# Patient Record
Sex: Male | Born: 1980 | Race: Black or African American | Hispanic: No | Marital: Married | Smoking: Current every day smoker
Health system: Southern US, Community
[De-identification: ages and names within clinical notes are randomized; demographics above are authoritative.]

## PROBLEM LIST (undated history)

## (undated) DIAGNOSIS — I10 Essential (primary) hypertension: Secondary | ICD-10-CM

## (undated) HISTORY — PX: ANTERIOR CRUCIATE LIGAMENT REPAIR: SHX115

## (undated) HISTORY — DX: Essential (primary) hypertension: I10

---

## 2011-10-29 ENCOUNTER — Emergency Department (INDEPENDENT_AMBULATORY_CARE_PROVIDER_SITE_OTHER): Payer: 59

## 2011-10-29 ENCOUNTER — Emergency Department (HOSPITAL_BASED_OUTPATIENT_CLINIC_OR_DEPARTMENT_OTHER)
Admission: EM | Admit: 2011-10-29 | Discharge: 2011-10-29 | Disposition: A | Discharge status: Home / Self Care | Payer: 59 | Attending: Emergency Medicine | Admitting: Emergency Medicine

## 2011-10-29 DIAGNOSIS — Y9367 Activity, basketball: Secondary | ICD-10-CM

## 2011-10-29 DIAGNOSIS — M25569 Pain in unspecified knee: Secondary | ICD-10-CM

## 2011-10-29 DIAGNOSIS — M25469 Effusion, unspecified knee: Secondary | ICD-10-CM

## 2011-10-29 DIAGNOSIS — X500XXA Overexertion from strenuous movement or load, initial encounter: Secondary | ICD-10-CM

## 2011-10-29 DIAGNOSIS — F17200 Nicotine dependence, unspecified, uncomplicated: Secondary | ICD-10-CM | POA: Insufficient documentation

## 2011-10-29 MED ORDER — HYDROCODONE-ACETAMINOPHEN 5-500 MG PO TABS: 1.0000 | ORAL_TABLET | Freq: Four times a day (QID) | ORAL | Status: AC | PRN

## 2011-10-29 NOTE — ED Notes (Signed)
Pt reports right knee pain that started yesterday while playing basketball.  Pain worsens with ambulation.

## 2011-10-29 NOTE — ED Provider Notes (Signed)
History     CSN: [id number] Arrival date & time: 10/29/2011  3:10 PM   First MD Initiated Contact with Patient 10/29/11 1522      Chief Complaint  Patient presents with  . Knee Pain    (Consider location/radiation/quality/duration/timing/severity/associated sxs/prior treatment) HPI Comments: Pt states that he was playing basketball and someone hit him in his right knee and he has had pain and swelling since  Patient is a 30 y.o. male presenting with knee pain. The history is provided by the patient. No language interpreter was used.  Knee Pain This is a new problem. The current episode started yesterday. The problem occurs constantly. The problem has been unchanged. Pertinent negatives include no numbness or weakness. The symptoms are aggravated by standing. Treatments tried: crutches. The treatment provided mild relief.    History reviewed. No pertinent past medical history.  History reviewed. No pertinent past surgical history.  No family history on file.  History  Substance Use Topics  . Smoking status: Current Everyday Smoker -- 0.5 packs/day  . Smokeless tobacco: Not on file  . Alcohol Use: Yes     weekends      Review of Systems  Neurological: Negative for weakness and numbness.  All other systems reviewed and are negative.    Allergies  Review of patient's allergies indicates no known allergies.  Home Medications   Current Outpatient Rx  Name Route Sig Dispense Refill  . HYDROCODONE-ACETAMINOPHEN 5-500 MG PO TABS Oral Take 1-2 tablets by mouth every 6 (six) hours as needed for pain. 15 tablet 0    BP 149/72  Pulse 91  Temp(Src) 98.9 F (37.2 C) (Oral)  Resp 16  Ht 6\' 1"  (1.854 m)  Wt 234 lb (106.142 kg)  BMI 30.87 kg/m2  SpO2 100%  Physical Exam  Nursing note and vitals reviewed. Constitutional: He appears well-developed and well-nourished.  Cardiovascular: Normal rate and regular rhythm.   Pulmonary/Chest: Effort normal and breath sounds  normal.  Musculoskeletal: He exhibits edema and tenderness.       Pt has generalized tenderness to the right knee:pt has decreased rom  Neurological: He is alert.  Skin: Skin is warm and dry.  Psychiatric: He has a normal mood and affect.    ED Course  Procedures (including critical care time)  Labs Reviewed - No data to display Dg Knee Complete 4 Views Right  10/29/2011  *RADIOLOGY REPORT*  Clinical Data: Basketball injury.  Twisting injury.  Pain.  RIGHT KNEE - COMPLETE 4+ VIEW  Comparison: None.  Findings: There is a moderate joint effusion.  Deep lateral femoral notch is seen on the lateral view which can be seen with ACL injuries.  No additional acute bony abnormality.  IMPRESSION: Deep lateral femoral notch which can be associated with ACL injuries.  Moderate joint effusion. MRI would be helpful for further evaluation if felt clinically indicated.  Original Report Authenticated By: Cyndie Chime, M.D.     1. Knee effusion       MDM  Discussed possibility of tear with pt:pt place in immobilizer:pt treat  With something for pain        Teressa Lower, NP 10/29/11 1615

## 2011-10-30 NOTE — ED Provider Notes (Signed)
Medical screening examination/treatment/procedure(s) were performed by non-physician practitioner and as supervising physician I was immediately available for consultation/collaboration.    Rathana Viveros R Arina Torry, MD 10/30/11 0001 

## 2011-11-04 ENCOUNTER — Other Ambulatory Visit: Payer: Self-pay | Admitting: Family Medicine

## 2011-11-04 DIAGNOSIS — M25561 Pain in right knee: Secondary | ICD-10-CM

## 2011-11-05 ENCOUNTER — Ambulatory Visit
Admission: RE | Admit: 2011-11-05 | Discharge: 2011-11-05 | Disposition: A | Discharge status: Home / Self Care | Payer: 59 | Source: Ambulatory Visit | Attending: Family Medicine | Admitting: Family Medicine

## 2011-11-05 DIAGNOSIS — M25561 Pain in right knee: Secondary | ICD-10-CM

## 2011-12-09 ENCOUNTER — Other Ambulatory Visit: Payer: Self-pay | Admitting: Orthopedic Surgery

## 2011-12-09 ENCOUNTER — Ambulatory Visit
Admission: RE | Admit: 2011-12-09 | Discharge: 2011-12-09 | Disposition: A | Discharge status: Home / Self Care | Payer: 59 | Source: Ambulatory Visit | Attending: Orthopedic Surgery | Admitting: Orthopedic Surgery

## 2011-12-09 DIAGNOSIS — M25561 Pain in right knee: Secondary | ICD-10-CM

## 2011-12-09 DIAGNOSIS — R0602 Shortness of breath: Secondary | ICD-10-CM

## 2011-12-09 MED ORDER — IOHEXOL 300 MG/ML  SOLN
125.0000 mL | Freq: Once | INTRAMUSCULAR | Status: AC | PRN
  Administered 2011-12-09: 125 mL via INTRAVENOUS

## 2013-03-06 IMAGING — CR DG KNEE COMPLETE 4+V*R*
4 series · 4 of 4 positions shown · non-contrast
Comparison: None.

CLINICAL DATA: Basketball injury.  Twisting injury.  Pain.

RIGHT KNEE - COMPLETE 4+ VIEW

[t knee ap right]
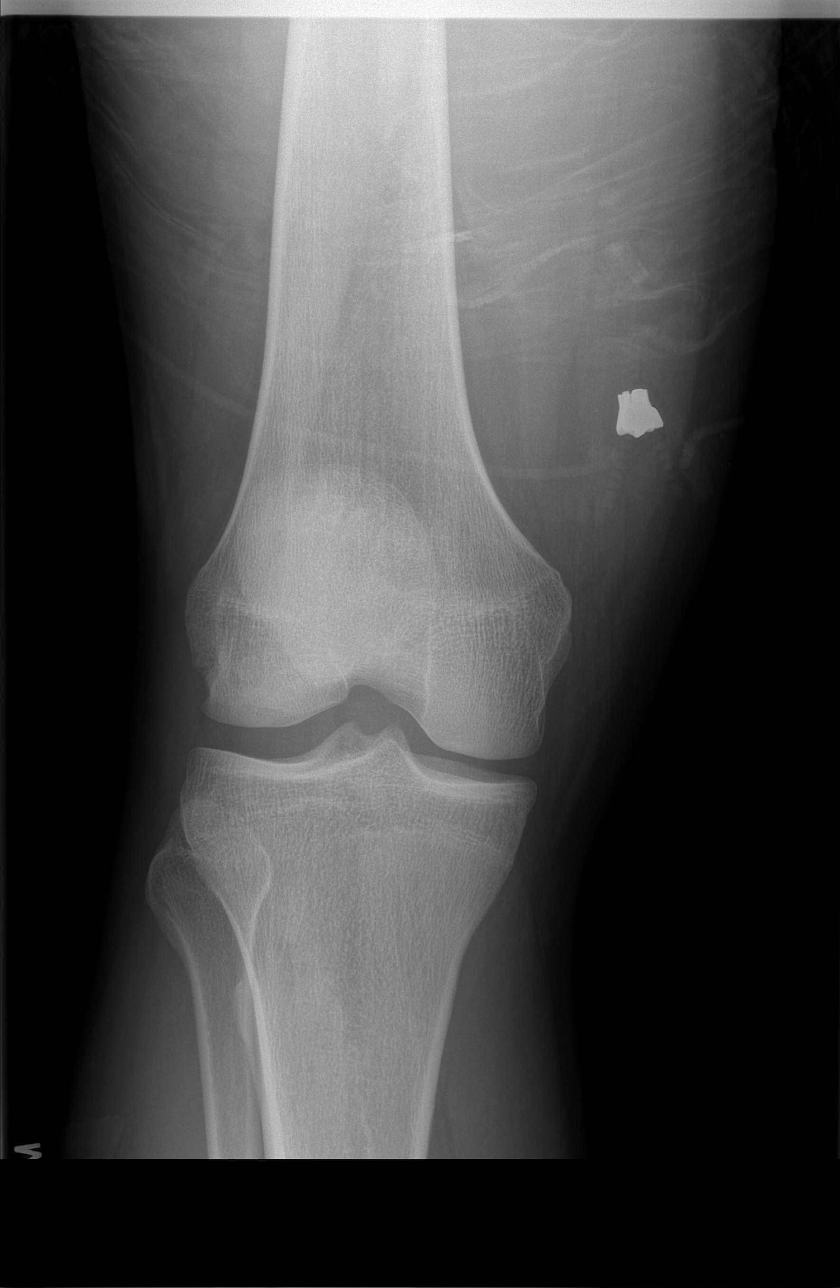

[t knee oblique right (1 of 2)]
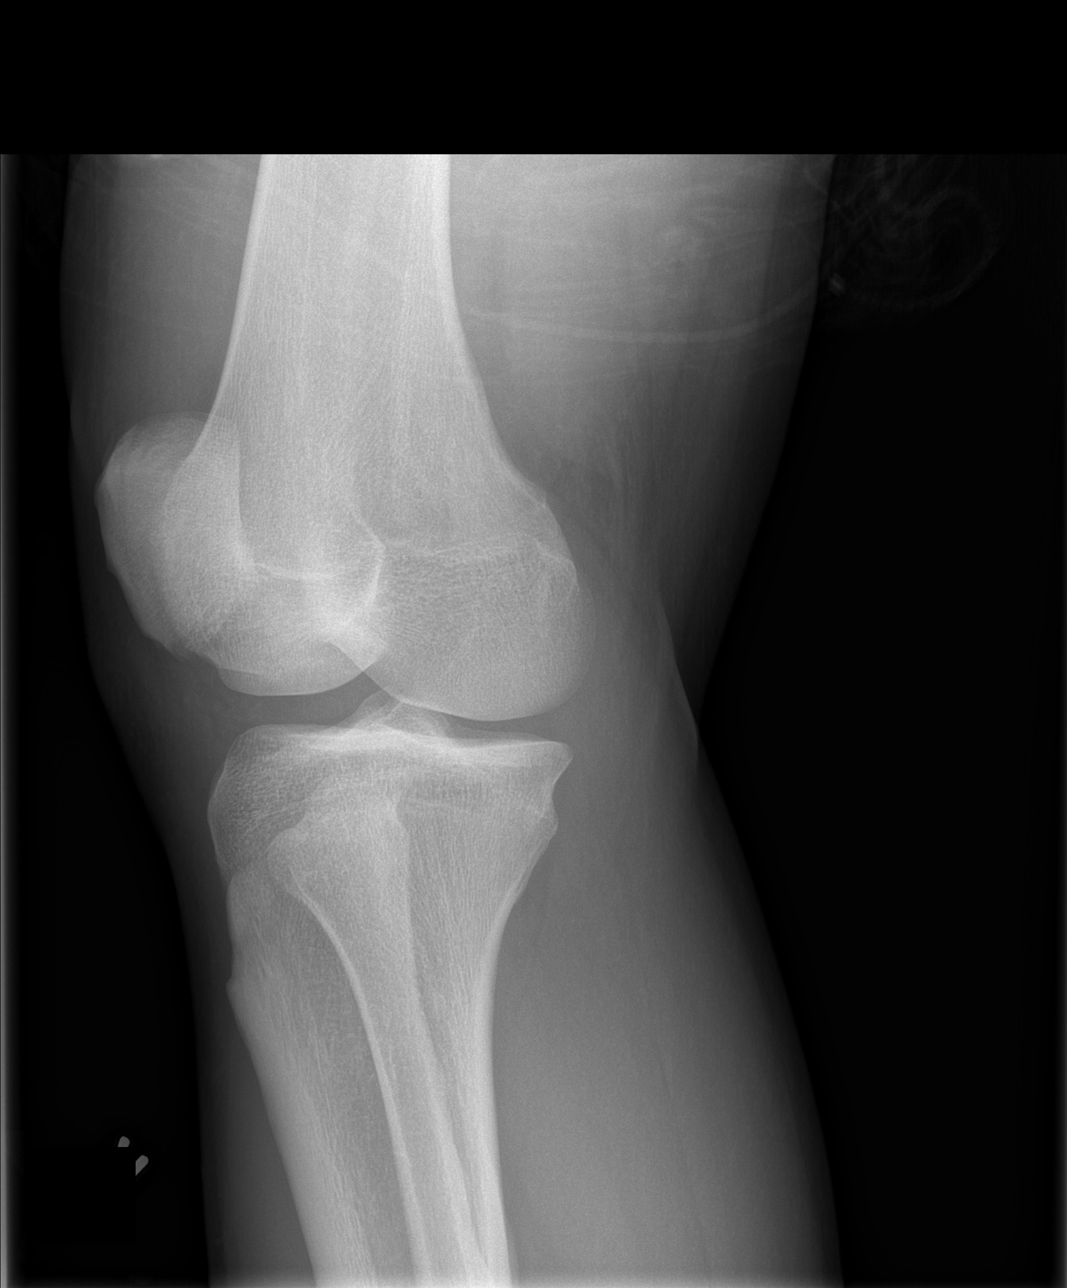

[t knee oblique right (2 of 2)]
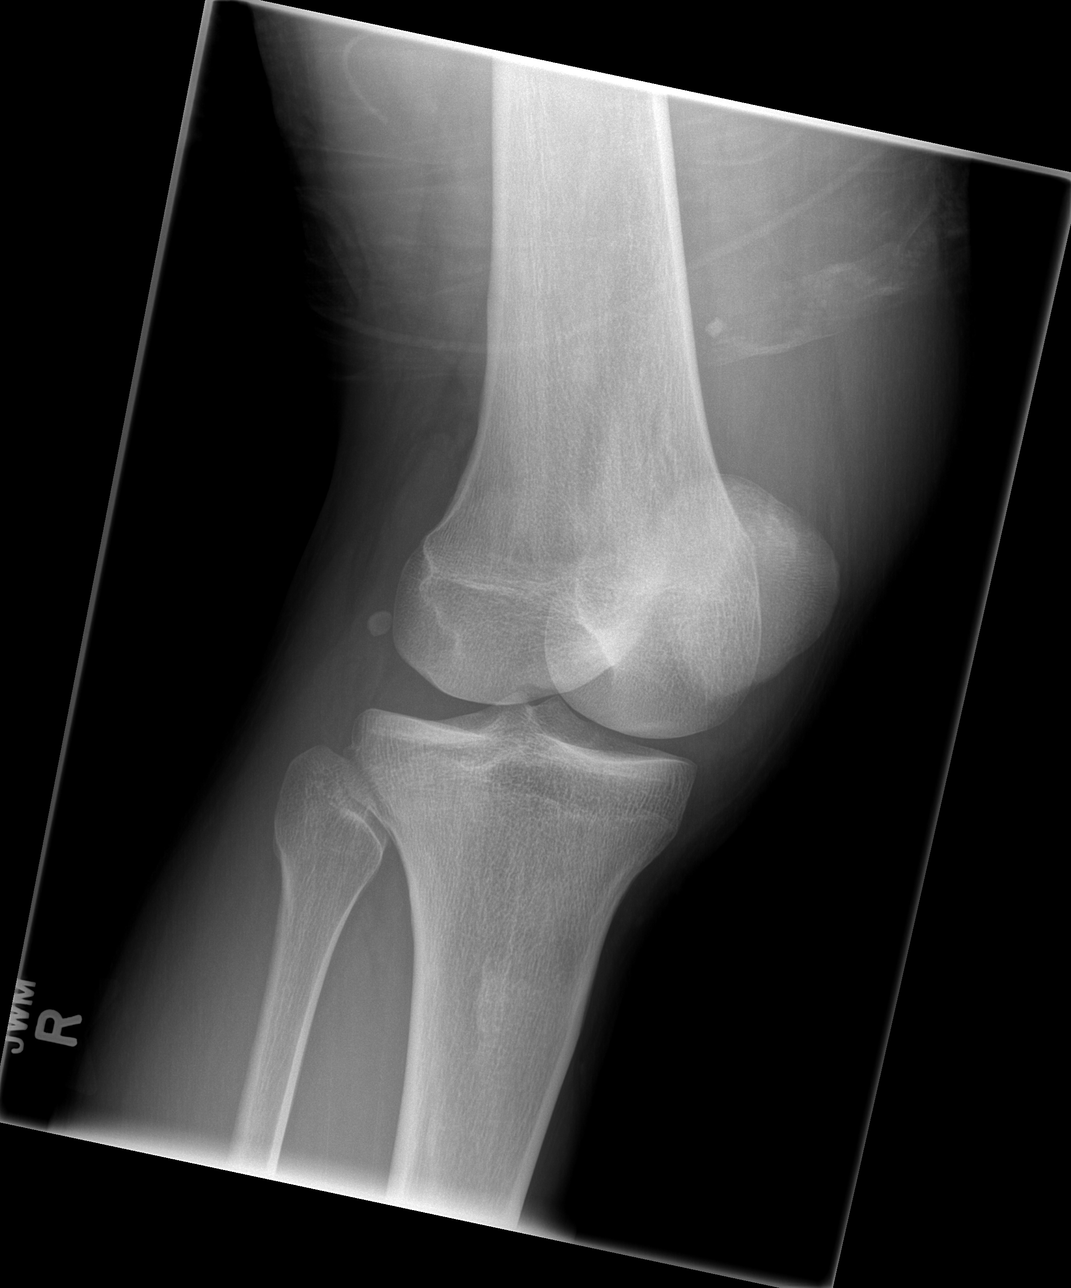

[t knee lat right]
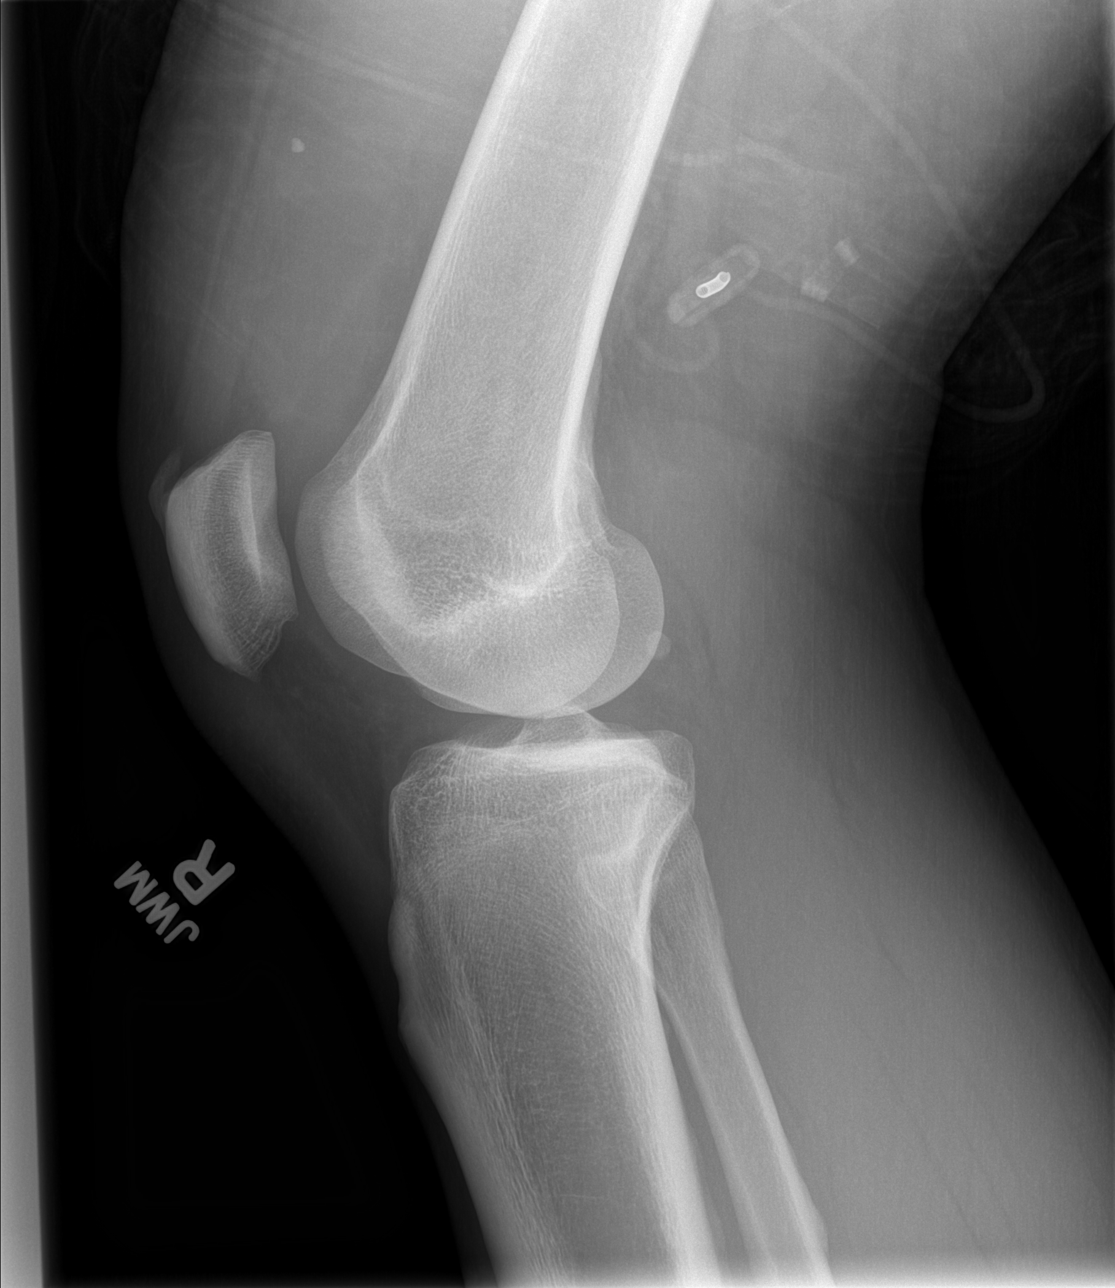

[4 of 4 positions shown; findings below may reference images not displayed]

FINDINGS: There is a moderate joint effusion.  Deep lateral femoral
notch is seen on the lateral view which can be seen with ACL
injuries.  No additional acute bony abnormality.
IMPRESSION: Deep lateral femoral notch which can be associated with ACL
injuries.  Moderate joint effusion. MRI would be helpful for
further evaluation if felt clinically indicated.

## 2015-10-24 ENCOUNTER — Telehealth: Payer: Self-pay | Admitting: Internal Medicine

## 2015-10-24 NOTE — Telephone Encounter (Signed)
Patient is requesting to establish care with Dr Yetta BarreJones.

## 2015-10-24 NOTE — Telephone Encounter (Signed)
yes

## 2015-10-26 NOTE — Telephone Encounter (Signed)
Got scheduled  °

## 2015-11-02 ENCOUNTER — Encounter: Payer: Self-pay | Admitting: Internal Medicine

## 2015-11-02 ENCOUNTER — Ambulatory Visit (INDEPENDENT_AMBULATORY_CARE_PROVIDER_SITE_OTHER): Payer: Managed Care, Other (non HMO) | Admitting: Internal Medicine

## 2015-11-02 ENCOUNTER — Other Ambulatory Visit (INDEPENDENT_AMBULATORY_CARE_PROVIDER_SITE_OTHER): Payer: Managed Care, Other (non HMO)

## 2015-11-02 VITALS — BP 142/78 | HR 78 | Temp 97.5°F | Resp 16 | Ht 73.0 in | Wt 254.0 lb

## 2015-11-02 DIAGNOSIS — I10 Essential (primary) hypertension: Secondary | ICD-10-CM | POA: Insufficient documentation

## 2015-11-02 DIAGNOSIS — Z0000 Encounter for general adult medical examination without abnormal findings: Secondary | ICD-10-CM

## 2015-11-02 LAB — CBC WITH DIFFERENTIAL/PLATELET
Basophils Absolute: 0 10*3/uL (ref 0.0–0.1)
Basophils Relative: 0.6 % (ref 0.0–3.0)
EOS PCT: 3.5 % (ref 0.0–5.0)
Eosinophils Absolute: 0.2 10*3/uL (ref 0.0–0.7)
HEMATOCRIT: 44.8 % (ref 39.0–52.0)
Hemoglobin: 15 g/dL (ref 13.0–17.0)
LYMPHS ABS: 2.1 10*3/uL (ref 0.7–4.0)
LYMPHS PCT: 33 % (ref 12.0–46.0)
MCHC: 33.4 g/dL (ref 30.0–36.0)
MCV: 90.7 fl (ref 78.0–100.0)
MONOS PCT: 15.4 % — AB (ref 3.0–12.0)
Monocytes Absolute: 1 10*3/uL (ref 0.1–1.0)
NEUTROS ABS: 3.1 10*3/uL (ref 1.4–7.7)
NEUTROS PCT: 47.5 % (ref 43.0–77.0)
PLATELETS: 224 10*3/uL (ref 150.0–400.0)
RBC: 4.94 Mil/uL (ref 4.22–5.81)
RDW: 14.3 % (ref 11.5–15.5)
WBC: 6.4 10*3/uL (ref 4.0–10.5)

## 2015-11-02 LAB — LIPID PANEL
Cholesterol: 140 mg/dL (ref 0–200)
HDL: 45.4 mg/dL (ref >39.00)
LDL Cholesterol: 69 mg/dL (ref 0–99)
NONHDL: 94.14
Total CHOL/HDL Ratio: 3
Triglycerides: 127 mg/dL (ref 0.0–149.0)
VLDL: 25.4 mg/dL (ref 0.0–40.0)

## 2015-11-02 LAB — COMPREHENSIVE METABOLIC PANEL
ALT: 46 U/L (ref 0–53)
AST: 29 U/L (ref 0–37)
Albumin: 4.2 g/dL (ref 3.5–5.2)
Alkaline Phosphatase: 51 U/L (ref 39–117)
BUN: 13 mg/dL (ref 6–23)
CHLORIDE: 104 meq/L (ref 96–112)
CO2: 31 meq/L (ref 19–32)
Calcium: 9.1 mg/dL (ref 8.4–10.5)
Creatinine, Ser: 1.21 mg/dL (ref 0.40–1.50)
GFR: 87.48 mL/min (ref >60.00)
GLUCOSE: 97 mg/dL (ref 70–99)
POTASSIUM: 3.9 meq/L (ref 3.5–5.1)
Sodium: 140 mEq/L (ref 135–145)
Total Bilirubin: 0.5 mg/dL (ref 0.2–1.2)
Total Protein: 7 g/dL (ref 6.0–8.3)

## 2015-11-02 LAB — URINALYSIS, ROUTINE W REFLEX MICROSCOPIC
BILIRUBIN URINE: NEGATIVE
HGB URINE DIPSTICK: NEGATIVE
KETONES UR: NEGATIVE
LEUKOCYTES UA: NEGATIVE
Nitrite: NEGATIVE
RBC / HPF: NONE SEEN (ref 0–?)
Specific Gravity, Urine: 1.02 (ref 1.000–1.030)
TOTAL PROTEIN, URINE-UPE24: NEGATIVE
URINE GLUCOSE: NEGATIVE
UROBILINOGEN UA: 0.2 (ref 0.0–1.0)
WBC, UA: NONE SEEN (ref 0–?)
pH: 7 (ref 5.0–8.0)

## 2015-11-02 LAB — TSH: TSH: 0.88 u[IU]/mL (ref 0.35–4.50)

## 2015-11-02 NOTE — Patient Instructions (Signed)
Hypertension Hypertension, commonly called high blood pressure, is when the force of blood pumping through your arteries is too strong. Your arteries are the blood vessels that carry blood from your heart throughout your body. A blood pressure reading consists of a higher number over a lower number, such as 110/72. The higher number (systolic) is the pressure inside your arteries when your heart pumps. The lower number (diastolic) is the pressure inside your arteries when your heart relaxes. Ideally you want your blood pressure below 120/80. Hypertension forces your heart to work harder to pump blood. Your arteries may become narrow or stiff. Having untreated or uncontrolled hypertension can cause heart attack, stroke, kidney disease, and other problems. RISK FACTORS Some risk factors for high blood pressure are controllable. Others are not.  Risk factors you cannot control include:   Race. You may be at higher risk if you are African American.  Age. Risk increases with age.  Gender. Men are at higher risk than women before age 45 years. After age 65, women are at higher risk than men. Risk factors you can control include:  Not getting enough exercise or physical activity.  Being overweight.  Getting too much fat, sugar, calories, or salt in your diet.  Drinking too much alcohol. SIGNS AND SYMPTOMS Hypertension does not usually cause signs or symptoms. Extremely high blood pressure (hypertensive crisis) may cause headache, anxiety, shortness of breath, and nosebleed. DIAGNOSIS To check if you have hypertension, your health care provider will measure your blood pressure while you are seated, with your arm held at the level of your heart. It should be measured at least twice using the same arm. Certain conditions can cause a difference in blood pressure between your right and left arms. A blood pressure reading that is higher than normal on one occasion does not mean that you need treatment. If  it is not clear whether you have high blood pressure, you may be asked to return on a different day to have your blood pressure checked again. Or, you may be asked to monitor your blood pressure at home for 1 or more weeks. TREATMENT Treating high blood pressure includes making lifestyle changes and possibly taking medicine. Living a healthy lifestyle can help lower high blood pressure. You may need to change some of your habits. Lifestyle changes may include:  Following the DASH diet. This diet is high in fruits, vegetables, and whole grains. It is low in salt, red meat, and added sugars.  Keep your sodium intake below 2,300 mg per day.  Getting at least 30-45 minutes of aerobic exercise at least 4 times per week.  Losing weight if necessary.  Not smoking.  Limiting alcoholic beverages.  Learning ways to reduce stress. Your health care provider may prescribe medicine if lifestyle changes are not enough to get your blood pressure under control, and if one of the following is true:  You are 18-59 years of age and your systolic blood pressure is above 140.  You are 60 years of age or older, and your systolic blood pressure is above 150.  Your diastolic blood pressure is above 90.  You have diabetes, and your systolic blood pressure is over 140 or your diastolic blood pressure is over 90.  You have kidney disease and your blood pressure is above 140/90.  You have heart disease and your blood pressure is above 140/90. Your personal target blood pressure may vary depending on your medical conditions, your age, and other factors. HOME CARE INSTRUCTIONS    Have your blood pressure rechecked as directed by your health care provider.   Take medicines only as directed by your health care provider. Follow the directions carefully. Blood pressure medicines must be taken as prescribed. The medicine does not work as well when you skip doses. Skipping doses also puts you at risk for  problems.  Do not smoke.   Monitor your blood pressure at home as directed by your health care provider. SEEK MEDICAL CARE IF:   You think you are having a reaction to medicines taken.  You have recurrent headaches or feel dizzy.  You have swelling in your ankles.  You have trouble with your vision. SEEK IMMEDIATE MEDICAL CARE IF:  You develop a severe headache or confusion.  You have unusual weakness, numbness, or feel faint.  You have severe chest or abdominal pain.  You vomit repeatedly.  You have trouble breathing. MAKE SURE YOU:   Understand these instructions.  Will watch your condition.  Will get help right away if you are not doing well or get worse.   This information is not intended to replace advice given to you by your health care provider. Make sure you discuss any questions you have with your health care provider.   Document Released: 11/04/2005 Document Revised: 03/21/2015 Document Reviewed: 08/27/2013 Elsevier Interactive Patient Education 2016 [employer]  

## 2015-11-02 NOTE — Progress Notes (Signed)
Pre visit review using our clinic review tool, if applicable. No additional management support is needed unless otherwise documented below in the visit note. 

## 2015-11-03 NOTE — Progress Notes (Signed)
Subjective:  Patient ID: Omar Keller, male    DOB: [birth year]  Age: 34 y.o. MRN: [id number]  CC: Hypertension and Annual Exam   HPI Omar Keller presents for a complete physical and blood pressure check. He feels well and offers no complaints. He has a history of borderline blood pressure elevation but has never been treated for high blood pressure.  History Nile has a past medical history of Hypertension.   He has no past surgical history on file.   His family history includes Diabetes in his father and mother; Hypertension in his mother. There is no history of Alcohol abuse, Cancer, COPD, Depression, Drug abuse, Early death, Hearing loss, Heart disease, Hyperlipidemia, Kidney disease, or Stroke.He reports that he has been smoking.  He has never used smokeless tobacco. He reports that he drinks about 3.0 oz of alcohol per week. He reports that he does not use illicit drugs.  No outpatient prescriptions prior to visit.   No facility-administered medications prior to visit.    ROS Review of Systems  Constitutional: Negative.  Negative for fever, chills, diaphoresis, appetite change and fatigue.  HENT: Negative.   Eyes: Negative.   Respiratory: Negative.  Negative for cough, choking, chest tightness, shortness of breath and stridor.   Cardiovascular: Negative.  Negative for chest pain, palpitations and leg swelling.  Gastrointestinal: Negative.  Negative for nausea, vomiting, abdominal pain, diarrhea, constipation and blood in stool.  Endocrine: Negative.   Genitourinary: Negative.  Negative for dysuria, urgency, frequency, hematuria, flank pain and difficulty urinating.  Musculoskeletal: Negative.  Negative for myalgias, back pain, joint swelling and arthralgias.  Skin: Negative.  Negative for color change and rash.  Allergic/Immunologic: Negative.   Neurological: Negative.  Negative for dizziness, syncope, weakness, light-headedness and numbness.  Hematological: Negative.   Negative for adenopathy. Does not bruise/bleed easily.  Psychiatric/Behavioral: Negative.  Negative for sleep disturbance and dysphoric mood. The patient is not nervous/anxious.     Objective:  BP 142/78 mmHg  Pulse 78  Temp(Src) 97.5 F (36.4 C) (Oral)  Resp 16  Ht 6\' 1"  (1.854 m)  Wt 254 lb (115.214 kg)  BMI 33.52 kg/m2  SpO2 97%  Physical Exam  Constitutional: He is oriented to person, place, and time. No distress.  HENT:  Head: Normocephalic and atraumatic.  Mouth/Throat: Oropharynx is clear and moist. No oropharyngeal exudate.  Eyes: Conjunctivae are normal. Right eye exhibits no discharge. Left eye exhibits no discharge. No scleral icterus.  Neck: Normal range of motion. Neck supple. No JVD present. No tracheal deviation present. No thyromegaly present.  Cardiovascular: Normal rate, regular rhythm, normal heart sounds and intact distal pulses.  Exam reveals no gallop and no friction rub.   No murmur heard. EKG-early repol is noted in the lateral leads, there is no LVH, Q waves or acute ST-T wave changes.  Pulmonary/Chest: Effort normal and breath sounds normal. No stridor. No respiratory distress. He has no wheezes. He has no rales. He exhibits no tenderness.  Abdominal: Soft. Bowel sounds are normal. He exhibits no distension and no mass. There is no tenderness. There is no rebound and no guarding. Hernia confirmed negative in the right inguinal area and confirmed negative in the left inguinal area.  Genitourinary: Testes normal and penis normal. Right testis shows no mass, no swelling and no tenderness. Right testis is descended. Left testis shows no mass, no swelling and no tenderness. Left testis is descended. Circumcised. No penile erythema or penile tenderness. No discharge found.  Musculoskeletal: Normal range  of motion. He exhibits no edema or tenderness.  Lymphadenopathy:    He has no cervical adenopathy.       Right: No inguinal adenopathy present.       Left: No  inguinal adenopathy present.  Neurological: He is oriented to person, place, and time.  Skin: Skin is warm and dry. No rash noted. He is not diaphoretic. No erythema. No pallor.  Psychiatric: He has a normal mood and affect. His behavior is normal. Judgment and thought content normal.  Vitals reviewed.     Assessment & Plan:   Omar Keller was seen today for hypertension and annual exam.  Diagnoses and all orders for this visit:  Routine general medical examination at a health care facility- vaccines were reviewed and updated, physical examination completed, labs ordered and reviewed, he was given patient education material. -     Lipid panel; Future -     TSH; Future -     Urinalysis, Routine w reflex microscopic (not at [facility]); Future -     Comprehensive metabolic panel; Future -     CBC with Differential/Platelet; Future  Essential hypertension, benign- EKG and labs do not show any secondary causes for hypertension, his blood pressure elevation is borderline so no antihypertensive therapy is needed at this time. He agrees to work on his lifestyle modifications and return in about 3 months for blood pressure check. -     EKG 12-Lead   Omar Keller does not currently have medications on file.  No orders of the defined types were placed in this encounter.     Follow-up: Return in about 2 months (around 01/03/2016).  Sanda Linger, MD

## 2016-02-06 ENCOUNTER — Ambulatory Visit (INDEPENDENT_AMBULATORY_CARE_PROVIDER_SITE_OTHER): Payer: Managed Care, Other (non HMO) | Admitting: Internal Medicine

## 2016-02-06 ENCOUNTER — Encounter: Payer: Self-pay | Admitting: Internal Medicine

## 2016-02-06 VITALS — BP 120/80 | HR 65 | Temp 98.8°F | Resp 16 | Ht 73.0 in | Wt 249.0 lb

## 2016-02-06 DIAGNOSIS — I10 Essential (primary) hypertension: Secondary | ICD-10-CM

## 2016-02-06 DIAGNOSIS — N529 Male erectile dysfunction, unspecified: Secondary | ICD-10-CM | POA: Insufficient documentation

## 2016-02-06 DIAGNOSIS — N521 Erectile dysfunction due to diseases classified elsewhere: Secondary | ICD-10-CM

## 2016-02-06 MED ORDER — SILDENAFIL CITRATE 50 MG PO TABS: 50.0000 mg | ORAL_TABLET | Freq: Every day | ORAL | Status: DC | PRN

## 2016-02-06 NOTE — Progress Notes (Signed)
Pre visit review using our clinic review tool, if applicable. No additional management support is needed unless otherwise documented below in the visit note. 

## 2016-02-06 NOTE — Patient Instructions (Signed)
Hypertension Hypertension, commonly called high blood pressure, is when the force of blood pumping through your arteries is too strong. Your arteries are the blood vessels that carry blood from your heart throughout your body. A blood pressure reading consists of a higher number over a lower number, such as 110/72. The higher number (systolic) is the pressure inside your arteries when your heart pumps. The lower number (diastolic) is the pressure inside your arteries when your heart relaxes. Ideally you want your blood pressure below 120/80. Hypertension forces your heart to work harder to pump blood. Your arteries may become narrow or stiff. Having untreated or uncontrolled hypertension can cause heart attack, stroke, kidney disease, and other problems. RISK FACTORS Some risk factors for high blood pressure are controllable. Others are not.  Risk factors you cannot control include:   Race. You may be at higher risk if you are African American.  Age. Risk increases with age.  Gender. Men are at higher risk than women before age 45 years. After age 65, women are at higher risk than men. Risk factors you can control include:  Not getting enough exercise or physical activity.  Being overweight.  Getting too much fat, sugar, calories, or salt in your diet.  Drinking too much alcohol. SIGNS AND SYMPTOMS Hypertension does not usually cause signs or symptoms. Extremely high blood pressure (hypertensive crisis) may cause headache, anxiety, shortness of breath, and nosebleed. DIAGNOSIS To check if you have hypertension, your health care provider will measure your blood pressure while you are seated, with your arm held at the level of your heart. It should be measured at least twice using the same arm. Certain conditions can cause a difference in blood pressure between your right and left arms. A blood pressure reading that is higher than normal on one occasion does not mean that you need treatment. If  it is not clear whether you have high blood pressure, you may be asked to return on a different day to have your blood pressure checked again. Or, you may be asked to monitor your blood pressure at home for 1 or more weeks. TREATMENT Treating high blood pressure includes making lifestyle changes and possibly taking medicine. Living a healthy lifestyle can help lower high blood pressure. You may need to change some of your habits. Lifestyle changes may include:  Following the DASH diet. This diet is high in fruits, vegetables, and whole grains. It is low in salt, red meat, and added sugars.  Keep your sodium intake below 2,300 mg per day.  Getting at least 30-45 minutes of aerobic exercise at least 4 times per week.  Losing weight if necessary.  Not smoking.  Limiting alcoholic beverages.  Learning ways to reduce stress. Your health care provider may prescribe medicine if lifestyle changes are not enough to get your blood pressure under control, and if one of the following is true:  You are 18-59 years of age and your systolic blood pressure is above 140.  You are 60 years of age or older, and your systolic blood pressure is above 150.  Your diastolic blood pressure is above 90.  You have diabetes, and your systolic blood pressure is over 140 or your diastolic blood pressure is over 90.  You have kidney disease and your blood pressure is above 140/90.  You have heart disease and your blood pressure is above 140/90. Your personal target blood pressure may vary depending on your medical conditions, your age, and other factors. HOME CARE INSTRUCTIONS    Have your blood pressure rechecked as directed by your health care provider.   Take medicines only as directed by your health care provider. Follow the directions carefully. Blood pressure medicines must be taken as prescribed. The medicine does not work as well when you skip doses. Skipping doses also puts you at risk for  problems.  Do not smoke.   Monitor your blood pressure at home as directed by your health care provider. SEEK MEDICAL CARE IF:   You think you are having a reaction to medicines taken.  You have recurrent headaches or feel dizzy.  You have swelling in your ankles.  You have trouble with your vision. SEEK IMMEDIATE MEDICAL CARE IF:  You develop a severe headache or confusion.  You have unusual weakness, numbness, or feel faint.  You have severe chest or abdominal pain.  You vomit repeatedly.  You have trouble breathing. MAKE SURE YOU:   Understand these instructions.  Will watch your condition.  Will get help right away if you are not doing well or get worse.   This information is not intended to replace advice given to you by your health care provider. Make sure you discuss any questions you have with your health care provider.   Document Released: 11/04/2005 Document Revised: 03/21/2015 Document Reviewed: 08/27/2013 Elsevier Interactive Patient Education 2016 [employer]  

## 2016-02-06 NOTE — Progress Notes (Signed)
Subjective:  Patient ID: Omar Keller, male    DOB: January 21, 1981  Age: 35 y.o. MRN: [id number]  CC: Hypertension   HPI Omar MooreShad Kiene presents for a blood pressure check. He tells me that his blood pressures at home have been normal. He exercises and has lost weight to lower his blood pressure. He denies headache, blurred vision, chest pain, dyspnea on exertion, fatigue, or edema. Is not currently taking an antihypertensive.  He also complains of erectile dysfunction for nearly 10 years. He complains that he can get an erection but then becomes anxious and cannot maintain the erection.  No outpatient prescriptions prior to visit.   No facility-administered medications prior to visit.    ROS Review of Systems  Constitutional: Negative.  Negative for fever, chills, diaphoresis, appetite change and fatigue.  HENT: Negative.   Eyes: Negative.   Respiratory: Negative.  Negative for cough, choking, chest tightness, shortness of breath and stridor.   Cardiovascular: Negative.  Negative for chest pain, palpitations and leg swelling.  Gastrointestinal: Negative.  Negative for nausea, vomiting, abdominal pain, diarrhea, constipation and blood in stool.  Endocrine: Negative.   Genitourinary: Negative.  Negative for dysuria, urgency, frequency, hematuria, enuresis, difficulty urinating, penile pain and testicular pain.  Musculoskeletal: Negative.   Skin: Negative.   Allergic/Immunologic: Negative.   Neurological: Negative.   Hematological: Negative.  Negative for adenopathy. Does not bruise/bleed easily.  Psychiatric/Behavioral: Negative.     Objective:  BP 120/80 mmHg  Pulse 65  Temp(Src) 98.8 F (37.1 C) (Oral)  Resp 16  Ht 6\' 1"  (1.854 m)  Wt 249 lb (112.946 kg)  BMI 32.86 kg/m2  SpO2 98%  BP Readings from Last 3 Encounters:  02/06/16 120/80  11/02/15 142/78  10/29/11 149/72    Wt Readings from Last 3 Encounters:  02/06/16 249 lb (112.946 kg)  11/02/15 254 lb (115.214 kg)    10/29/11 234 lb (106.142 kg)    Physical Exam  Constitutional: He is oriented to person, place, and time. No distress.  HENT:  Head: Normocephalic and atraumatic.  Mouth/Throat: Oropharynx is clear and moist. No oropharyngeal exudate.  Eyes: Conjunctivae are normal. Right eye exhibits no discharge. Left eye exhibits no discharge. No scleral icterus.  Neck: Normal range of motion. Neck supple. No JVD present. No tracheal deviation present. No thyromegaly present.  Cardiovascular: Normal rate, regular rhythm, normal heart sounds and intact distal pulses.  Exam reveals no gallop and no friction rub.   No murmur heard. Pulmonary/Chest: Effort normal and breath sounds normal. No stridor. No respiratory distress. He has no wheezes. He has no rales. He exhibits no tenderness.  Abdominal: Soft. Bowel sounds are normal. He exhibits no distension and no mass. There is no tenderness. There is no rebound and no guarding.  Musculoskeletal: Normal range of motion. He exhibits no edema or tenderness.  Lymphadenopathy:    He has no cervical adenopathy.  Neurological: He is oriented to person, place, and time.  Skin: Skin is warm and dry. No rash noted. He is not diaphoretic. No erythema. No pallor.  Vitals reviewed.   Lab Results  Component Value Date   WBC 6.4 11/02/2015   HGB 15.0 11/02/2015   HCT 44.8 11/02/2015   PLT 224.0 11/02/2015   GLUCOSE 97 11/02/2015   CHOL 140 11/02/2015   TRIG 127.0 11/02/2015   HDL 45.40 11/02/2015   LDLCALC 69 11/02/2015   ALT 46 11/02/2015   AST 29 11/02/2015   NA 140 11/02/2015   K 3.9 11/02/2015  CL 104 11/02/2015   CREATININE 1.21 11/02/2015   BUN 13 11/02/2015   CO2 31 11/02/2015   TSH 0.88 11/02/2015    Ct Angio Chest W/cm &/or Wo Cm  12/09/2011  *RADIOLOGY REPORT* Clinical Data: Tree.  Evaluate for pulmonary embolus. CT ANGIOGRAPHY CHEST Technique:  Multidetector CT imaging of the chest using the standard protocol during bolus administration of  intravenous contrast. Multiplanar reconstructed images including MIPs were obtained and reviewed to evaluate the vascular anatomy. Contrast: OMNIPAQUE IOHEXOL 300 MG/ML IV SOLN Comparison: None. Findings: There is no large central pulmonary embolus and main pulmonary arteries or lobar pulmonary arteries.  Segmental and subsegmental pulmonary arteries to the upper and lower lobes are less reliably evaluated secondary to motion artifact secondary to patient breathing during image acquisition.  There is no thoracic aortic aneurysm.  There is no dissection of the thoracic aorta. No axillary, mediastinal, or hilar lymphadenopathy.  Heart size is normal.  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  There is no pericardial or pleural effusion. Lung windows are without evidence for pulmonary edema.  There is no focal airspace consolidation.  No pulmonary parenchymal nodule or mass. Bone windows reveal no worrisome lytic or sclerotic osseous lesions. IMPRESSION: No CT evidence for acute pulmonary embolus although segmental and subsegmental pulmonary arteries may not be reliably evaluated secondary to patient breathing throughout image acquisition. Otherwise normal exam. Original Report Authenticated By: ERIC A. MANSELL, M.D.  US Venous Img Lower Unilateral Right  12/09/2011  *RADIOLOGY REPORT* Clinical Data: Pain and swelling.  Recent knee surgery. RIGHT LOWER EXTREMITY VENOUS DOPPLER ULTRASOUND Technique: Gray-scale sonography with compression, as well as color and duplex ultrasound, were performed to evaluate the deep venous system from the level of the common femoral vein through the popliteal and proximal calf veins. Comparison: None Findings:  Normal compressibility of  the common femoral, superficial femoral, and popliteal veins, as well as the proximal calf veins.  No filling defects to suggest DVT on grayscale or color Doppler imaging.  Doppler waveforms show  normal direction of venous flow, normal respiratory phasicity and response to augmentation. IMPRESSION: No evidence of  lower extremity deep vein thrombosis. Original Report Authenticated By: Osa Craver, M.D.   Assessment & Plan:   Yisrael was seen today for hypertension.  Diagnoses and all orders for this visit:  Erectile dysfunction due to diseases classified elsewhere- there is no evidence of organic cause for this, he will try viagra -     sildenafil (VIAGRA) 50 MG tablet; Take 1 tablet (50 mg total) by mouth daily as needed for erectile dysfunction.  Essential hypertension, benign- his BP is well controlled with lifestyle modifications   I am having Mr. Naraine start on sildenafil.  Meds ordered this encounter  Medications  . sildenafil (VIAGRA) 50 MG tablet    Sig: Take 1 tablet (50 mg total) by mouth daily as needed for erectile dysfunction.    Dispense:  10 tablet    Refill:  11     Follow-up: Return in about 6 months (around 08/08/2016).  Sanda Linger, MD

## 2016-04-05 ENCOUNTER — Telehealth: Payer: Self-pay | Admitting: Internal Medicine

## 2016-04-05 DIAGNOSIS — N521 Erectile dysfunction due to diseases classified elsewhere: Secondary | ICD-10-CM

## 2016-04-05 MED ORDER — SILDENAFIL CITRATE 50 MG PO TABS
50.0000 mg | ORAL_TABLET | Freq: Every day | ORAL | Status: DC | PRN
Start: 2016-04-05 — End: 2016-06-21

## 2016-04-05 NOTE — Telephone Encounter (Signed)
I do have some samples for now Viagra is going to be generic later in the year so I will not have samples much longer

## 2016-04-05 NOTE — Telephone Encounter (Signed)
Patient is requesting for sildenafil (VIAGRA) 50 MG tablet [[id number]][[id number]] to be sent back in to pharmacy on file. He never picked it up. He is also asking for samples.

## 2016-04-05 NOTE — Telephone Encounter (Signed)
Med resent. Tried calling pharmacy twice long hold time. Will route for samples

## 2016-06-18 ENCOUNTER — Telehealth: Payer: Self-pay | Admitting: Emergency Medicine

## 2016-06-18 NOTE — Telephone Encounter (Signed)
Pt called and wanted to know if he can get some samples of Viagra. Please follow up thanks.

## 2016-06-19 NOTE — Telephone Encounter (Signed)
Patient is requesting new script to be sent to [facility] on [address].

## 2016-06-21 ENCOUNTER — Other Ambulatory Visit: Payer: Self-pay

## 2016-06-21 DIAGNOSIS — N521 Erectile dysfunction due to diseases classified elsewhere: Secondary | ICD-10-CM

## 2016-06-21 MED ORDER — SILDENAFIL CITRATE 50 MG PO TABS: 50.0000 mg | ORAL_TABLET | Freq: Every day | ORAL | 11 refills | Status: DC | PRN

## 2016-06-21 NOTE — Telephone Encounter (Signed)
rx resent to pharm patient requested

## 2016-11-04 ENCOUNTER — Ambulatory Visit: Payer: Managed Care, Other (non HMO) | Admitting: Internal Medicine

## 2016-11-05 ENCOUNTER — Encounter: Payer: Self-pay | Admitting: Nurse Practitioner

## 2016-11-05 ENCOUNTER — Ambulatory Visit (INDEPENDENT_AMBULATORY_CARE_PROVIDER_SITE_OTHER): Payer: Managed Care, Other (non HMO) | Admitting: Nurse Practitioner

## 2016-11-05 VITALS — BP 120/70 | HR 68 | Temp 97.6°F | Ht 73.0 in | Wt 259.0 lb

## 2016-11-05 DIAGNOSIS — B029 Zoster without complications: Secondary | ICD-10-CM

## 2016-11-05 DIAGNOSIS — M5414 Radiculopathy, thoracic region: Secondary | ICD-10-CM

## 2016-11-05 DIAGNOSIS — B028 Zoster with other complications: Secondary | ICD-10-CM

## 2016-11-05 MED ORDER — GABAPENTIN 300 MG PO CAPS: 300.0000 mg | ORAL_CAPSULE | Freq: Every day | ORAL | 0 refills | Status: DC

## 2016-11-05 MED ORDER — CALAMINE EX LOTN: 1.0000 "application " | TOPICAL_LOTION | CUTANEOUS | 0 refills | Status: AC | PRN

## 2016-11-05 MED ORDER — VALACYCLOVIR HCL 1 G PO TABS: 1000.0000 mg | ORAL_TABLET | Freq: Three times a day (TID) | ORAL | 0 refills | Status: DC

## 2016-11-05 NOTE — Patient Instructions (Signed)
Shingles Shingles is an infection that causes a painful skin rash and fluid-filled blisters. Shingles is caused by the same virus that causes chickenpox. Shingles only develops in people who:  Have had chickenpox.  Have gotten the chickenpox vaccine. (This is rare.) The first symptoms of shingles may be itching, tingling, or pain in an area on your skin. A rash will follow in a few days or weeks. The rash is usually on one side of the body in a bandlike or beltlike pattern. Over time, the rash turns into fluid-filled blisters that break open, scab over, and dry up. Medicines may:  Help you manage pain.  Help you recover more quickly.  Help to prevent long-term problems. Follow these instructions at home: Medicines  Take medicines only as told by your doctor.  Apply an anti-itch or numbing cream to the affected area as told by your doctor. Blister and Rash Care  Take a cool bath or put cool compresses on the area of the rash or blisters as told by your doctor. This may help with pain and itching.  Keep your rash covered with a loose bandage (dressing). Wear loose-fitting clothing.  Keep your rash and blisters clean with mild soap and cool water or as told by your doctor.  Check your rash every day for signs of infection. These include redness, swelling, and pain that lasts or gets worse.  Do not pick your blisters.  Do not scratch your rash. General instructions  Rest as told by your doctor.  Keep all follow-up visits as told by your doctor. This is important.  Until your blisters scab over, your infection can cause chickenpox in people who have never had it or been vaccinated against it. To prevent this from happening, avoid touching other people or being around other people, especially:  Babies.  Pregnant women.  Children who have eczema.  Elderly people who have transplants.  People who have chronic illnesses, such as leukemia or AIDS. Contact a doctor if:  Your  pain does not get better with medicine.  Your pain does not get better after the rash heals.  Your rash looks infected. Signs of infection include:  Redness.  Swelling.  Pain that lasts or gets worse. Get help right away if:  The rash is on your face or nose.  You have pain in your face, pain around your eye area, or loss of feeling on one side of your face.  You have ear pain or you have ringing in your ear.  You have loss of taste.  Your condition gets worse. This information is not intended to replace advice given to you by your health care provider. Make sure you discuss any questions you have with your health care provider. Document Released: 04/22/2008 Document Revised: 06/30/2016 Document Reviewed: 08/16/2014 Elsevier Interactive Patient Education  2017 [employer]  

## 2016-11-05 NOTE — Progress Notes (Signed)
Subjective:  Patient ID: Omar Keller, male    DOB: 1981-10-19  Age: 35 y.o. MRN: [id number]  CC: Rash (rash on lower back (spreading), red, painful. )   Rash  This is a new problem. The current episode started in the past 7 days. The problem has been gradually worsening since onset. The affected locations include the torso (right side). The rash is characterized by blistering, burning, pain and itchiness. It is unknown if there was an exposure to a precipitant. Pertinent negatives include no anorexia, congestion, cough, facial edema, fatigue, fever or joint pain. Past treatments include topical steroids. The treatment provided no relief. His past medical history is significant for varicella.    Outpatient Medications Prior to Visit  Medication Sig Dispense Refill  . sildenafil (VIAGRA) 50 MG tablet Take 1 tablet (50 mg total) by mouth daily as needed for erectile dysfunction. 10 tablet 11   No facility-administered medications prior to visit.     ROS See HPI  Objective:  BP 120/70   Pulse 68   Temp 97.6 F (36.4 C)   Ht 6\' 1"  (1.854 m)   Wt 259 lb (117.5 kg)   SpO2 98%   BMI 34.17 kg/m   BP Readings from Last 3 Encounters:  11/05/16 120/70  02/06/16 120/80  11/02/15 (!) 142/78    Wt Readings from Last 3 Encounters:  11/05/16 259 lb (117.5 kg)  02/06/16 249 lb (112.9 kg)  11/02/15 254 lb (115.2 kg)    Physical Exam  Constitutional: He is oriented to person, place, and time.  Cardiovascular: Normal rate.   Pulmonary/Chest: Effort normal.  Musculoskeletal: Normal range of motion. He exhibits no edema.  Neurological: He is alert and oriented to person, place, and time.  Skin: Rash noted. Rash is vesicular. There is erythema.       Lab Results  Component Value Date   WBC 6.4 11/02/2015   HGB 15.0 11/02/2015   HCT 44.8 11/02/2015   PLT 224.0 11/02/2015   GLUCOSE 97 11/02/2015   CHOL 140 11/02/2015   TRIG 127.0 11/02/2015   HDL 45.40 11/02/2015   LDLCALC 69  11/02/2015   ALT 46 11/02/2015   AST 29 11/02/2015   NA 140 11/02/2015   K 3.9 11/02/2015   CL 104 11/02/2015   CREATININE 1.21 11/02/2015   BUN 13 11/02/2015   CO2 31 11/02/2015   TSH 0.88 11/02/2015    Ct Angio Chest W/cm &/or Wo Cm  Result Date: 12/09/2011 *RADIOLOGY REPORT* Clinical Data: Tree.  Evaluate for pulmonary embolus. CT ANGIOGRAPHY CHEST Technique:  Multidetector CT imaging of the chest using the standard protocol during bolus administration of intravenous contrast. Multiplanar reconstructed images including MIPs were obtained and reviewed to evaluate the vascular anatomy. Contrast: 125mL OMNIPAQUE IOHEXOL 300 MG/ML IV SOLN Comparison: None. Findings: There is no large central pulmonary embolus and main pulmonary arteries or lobar pulmonary arteries.  Segmental and subsegmental pulmonary arteries to the upper and lower lobes are less reliably evaluated secondary to motion artifact secondary to patient breathing during image acquisition.  There is no thoracic aortic aneurysm.  There is no dissection of the thoracic aorta. No axillary, mediastinal, or hilar lymphadenopathy.  Heart size is normal.  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  There is no pericardial or pleural effusion. Lung windows are without evidence for pulmonary edema.  There is no focal airspace consolidation.  No pulmonary parenchymal nodule or mass. Bone windows reveal no worrisome lytic  or sclerotic osseous lesions. IMPRESSION: No CT evidence for acute pulmonary embolus although segmental and subsegmental pulmonary arteries may not be reliably evaluated secondary to patient breathing throughout image acquisition. Otherwise normal exam. Original Report Authenticated By: ERIC A. MANSELL, M.D.  [place] Venous Img Lower Unilateral Right  Result Date: 12/09/2011 *RADIOLOGY REPORT* Clinical Data: Pain and swelling.  Recent knee surgery. RIGHT LOWER EXTREMITY VENOUS DOPPLER  ULTRASOUND Technique: Gray-scale sonography with compression, as well as color and duplex ultrasound, were performed to evaluate the deep venous system from the level of the common femoral vein through the popliteal and proximal calf veins. Comparison: None Findings:  Normal compressibility of  the common femoral, superficial femoral, and popliteal veins, as well as the proximal calf veins.  No filling defects to suggest DVT on grayscale or color Doppler imaging.  Doppler waveforms show normal direction of venous flow, normal respiratory phasicity and response to augmentation. IMPRESSION: No evidence of  lower extremity deep vein thrombosis. Original Report Authenticated By: Osa Craver. DANIEL HASSELL III, M.D.   Assessment & Plan:   Judie BonusShad was seen today for rash.  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 -     gabapentin (NEURONTIN) 300 MG capsule; Take 1 capsule (300 mg total) by mouth at bedtime. -     calamine lotion; Apply 1 application topically as needed for itching.   I am having Mr. Tome start on valACYclovir, gabapentin, and calamine. I am also having him maintain his sildenafil.  Meds ordered this encounter  Medications  . valACYclovir (VALTREX) 1000 MG tablet    Sig: Take 1 tablet (1,000 mg total) by mouth 3 (three) times daily.    Dispense:  21 tablet    Refill:  0    Order Specific Question:   Supervising Provider    Answer:   Tresa GarterPLOTNIKOV, ALEKSEI V [[id number]]  . gabapentin (NEURONTIN) 300 MG capsule    Sig: Take 1 capsule (300 mg total) by mouth at bedtime.    Dispense:  30 capsule    Refill:  0    Order Specific Question:   Supervising Provider    Answer:   Tresa GarterPLOTNIKOV, ALEKSEI V [[id number]]  . calamine lotion    Sig: Apply 1 application topically as needed for itching.    Dispense:  120 mL     Refill:  0    Order Specific Question:   Supervising Provider    Answer:   Tresa GarterPLOTNIKOV, ALEKSEI V [[id number]]    Follow-up: Return if symptoms worsen or fail to improve.  Alysia Pennaharlotte Nche, NP

## 2016-11-05 NOTE — Progress Notes (Signed)
Pre visit review using our clinic review tool, if applicable. No additional management support is needed unless otherwise documented below in the visit note. 

## 2017-04-10 ENCOUNTER — Other Ambulatory Visit: Payer: Self-pay | Admitting: Internal Medicine

## 2017-04-10 DIAGNOSIS — N521 Erectile dysfunction due to diseases classified elsewhere: Secondary | ICD-10-CM

## 2017-04-30 ENCOUNTER — Other Ambulatory Visit: Payer: Self-pay | Admitting: Internal Medicine

## 2017-04-30 DIAGNOSIS — N521 Erectile dysfunction due to diseases classified elsewhere: Secondary | ICD-10-CM

## 2017-07-08 ENCOUNTER — Encounter: Payer: Self-pay | Admitting: Physician Assistant

## 2017-07-08 ENCOUNTER — Ambulatory Visit (INDEPENDENT_AMBULATORY_CARE_PROVIDER_SITE_OTHER): Payer: 59 | Admitting: Physician Assistant

## 2017-07-08 VITALS — BP 136/78 | HR 62 | Temp 98.3°F | Ht 73.0 in | Wt 255.0 lb

## 2017-07-08 DIAGNOSIS — R635 Abnormal weight gain: Secondary | ICD-10-CM

## 2017-07-08 DIAGNOSIS — E669 Obesity, unspecified: Secondary | ICD-10-CM

## 2017-07-08 MED ORDER — PHENTERMINE HCL 37.5 MG PO TABS: 37.5000 mg | ORAL_TABLET | Freq: Every day | ORAL | 0 refills | Status: DC

## 2017-07-08 NOTE — Progress Notes (Signed)
   Subjective:    Patient ID: Omar Keller, male    DOB: 02-16-1981, 36 y.o.   MRN: [id number]  HPI  Pt is a 36 yo AA male who presents to the clinic to discuss weight loss. He had a friend that has lost weight with phentermine and he is interested. He lifts weight, walks, jogs, bicycle but continues to not be able to lose weight and hold on to belly fat. He has never tried medication. He feels bloated a lot as well. Bowel movements are normal.   .. Active Ambulatory Problems    Diagnosis Date Noted  . Routine general medical examination at a health care facility 11/02/2015  . Essential hypertension, benign 11/02/2015  . Erectile dysfunction 02/06/2016   Resolved Ambulatory Problems    Diagnosis Date Noted  . No Resolved Ambulatory Problems   Past Medical History:  Diagnosis Date  . Hypertension    .Marland Kitchen Family History  Problem Relation Age of Onset  . Hypertension Mother   . Diabetes Mother   . Diabetes Father   . Hypertension Father   . Alcohol abuse Neg Hx   . Cancer Neg Hx   . COPD Neg Hx   . Depression Neg Hx   . Drug abuse Neg Hx   . Early death Neg Hx   . Hearing loss Neg Hx   . Heart disease Neg Hx   . Hyperlipidemia Neg Hx   . Kidney disease Neg Hx   . Stroke Neg Hx    .     Review of Systems  All other systems reviewed and are negative.      Objective:   Physical Exam  Constitutional: He is oriented to person, place, and time. He appears well-developed and well-nourished.  HENT:  Head: Normocephalic and atraumatic.  Cardiovascular: Normal rate, regular rhythm and normal heart sounds.   Pulmonary/Chest: Effort normal and breath sounds normal.  Neurological: He is alert and oriented to person, place, and time.  Psychiatric: He has a normal mood and affect. His behavior is normal.          Assessment & Plan:  Marland KitchenMarland KitchenShad was seen today for establish care.  Diagnoses and all orders for this visit:  Obesity (BMI 30.0-34.9) -     phentermine (ADIPEX-P)  37.5 MG tablet; Take 1 tablet (37.5 mg total) by mouth daily before breakfast.  Abnormal weight gain -     phentermine (ADIPEX-P) 37.5 MG tablet; Take 1 tablet (37.5 mg total) by mouth daily before breakfast.   Discussed side effects of phentermine. Pt is aware one side effect is ED.  Discussed low carb diet with 1500 calories and 80g of protein.  Exercising at least 150 minutes a week.  [employer] could be a [employer].  Follow up in 1 month.  Will continue to watch borderline BP.

## 2017-07-08 NOTE — Patient Instructions (Addendum)
saxenda  belivq contrave  Phentermine tablets or capsules What is this medicine? PHENTERMINE (FEN ter meen) decreases your appetite. It is used with a reduced calorie diet and exercise to help you lose weight. This medicine may be used for other purposes; ask your health care provider or pharmacist if you have questions. COMMON BRAND NAME(S): Adipex-P, Atti-Plex P, Atti-Plex P Spansule, Fastin, Lomaira, Pro-Fast, Tara-8 What should I tell my health care provider before I take this medicine? They need to know if you have any of these conditions: -agitation -glaucoma -heart disease -high blood pressure -history of substance abuse -lung disease called Primary Pulmonary Hypertension (PPH) -taken an MAOI like Carbex, Eldepryl, Marplan, Nardil, or Parnate in last 14 days -thyroid disease -an unusual or allergic reaction to phentermine, other medicines, foods, dyes, or preservatives -pregnant or trying to get pregnant -breast-feeding How should I use this medicine? Take this medicine by mouth with a glass of water. Follow the directions on the prescription label. The instructions for use may differ based on the product and dose you are taking. Avoid taking this medicine in the evening. It may interfere with sleep. Take your doses at regular intervals. Do not take your medicine more often than directed. Talk to your pediatrician regarding the use of this medicine in children. While this drug may be prescribed for children 17 years or older for selected conditions, precautions do apply. Overdosage: If you think you have taken too much of this medicine contact a poison control center or emergency room at once. NOTE: This medicine is only for you. Do not share this medicine with others. What if I miss a dose? If you miss a dose, take it as soon as you can. If it is almost time for your next dose, take only that dose. Do not take double or extra doses. What may interact with this medicine? Do not  take this medicine with any of the following medications: -duloxetine -MAOIs like Carbex, Eldepryl, Marplan, Nardil, and Parnate -medicines for colds or breathing difficulties like pseudoephedrine or phenylephrine -procarbazine -sibutramine -SSRIs like citalopram, escitalopram, fluoxetine, fluvoxamine, paroxetine, and sertraline -stimulants like dexmethylphenidate, methylphenidate or modafinil -venlafaxine This medicine may also interact with the following medications: -medicines for diabetes This list may not describe all possible interactions. Give your health care provider a list of all the medicines, herbs, non-prescription drugs, or dietary supplements you use. Also tell them if you smoke, drink alcohol, or use illegal drugs. Some items may interact with your medicine. What should I watch for while using this medicine? Notify your physician immediately if you become short of breath while doing your normal activities. Do not take this medicine within 6 hours of bedtime. It can keep you from getting to sleep. Avoid drinks that contain caffeine and try to stick to a regular bedtime every night. This medicine was intended to be used in addition to a healthy diet and exercise. The best results are achieved this way. This medicine is only indicated for short-term use. Eventually your weight loss may level out. At that point, the drug will only help you maintain your new weight. Do not increase or in any way change your dose without consulting your doctor. You may get drowsy or dizzy. Do not drive, use machinery, or do anything that needs mental alertness until you know how this medicine affects you. Do not stand or sit up quickly, especially if you are an older patient. This reduces the risk of dizzy or fainting spells. Alcohol may increase  dizziness and drowsiness. Avoid alcoholic drinks. What side effects may I notice from receiving this medicine? Side effects that you should report to your doctor  or health care professional as soon as possible: -chest pain, palpitations -depression or severe changes in mood -increased blood pressure -irritability -nervousness or restlessness -severe dizziness -shortness of breath -problems urinating -unusual swelling of the legs -vomiting Side effects that usually do not require medical attention (report to your doctor or health care professional if they continue or are bothersome): -blurred vision or other eye problems -changes in sexual ability or desire -constipation or diarrhea -difficulty sleeping -dry mouth or unpleasant taste -headache -nausea This list may not describe all possible side effects. Call your doctor for medical advice about side effects. You may report side effects to FDA at 1-800-FDA-1088. Where should I keep my medicine? Keep out of the reach of children. This medicine can be abused. Keep your medicine in a safe place to protect it from theft. Do not share this medicine with anyone. Selling or giving away this medicine is dangerous and against the law. This medicine may cause accidental overdose and death if taken by other adults, children, or pets. Mix any unused medicine with a substance like cat litter or coffee grounds. Then throw the medicine away in a sealed container like a sealed bag or a coffee can with a lid. Do not use the medicine after the expiration date. Store at room temperature between 20 and 25 degrees C (68 and 77 degrees F). Keep container tightly closed. NOTE: This sheet is a summary. It may not cover all possible information. If you have questions about this medicine, talk to your doctor, pharmacist, or health care provider.  2018 Elsevier/Gold Standard (2015-08-11 12:53:15)    Gluten-Free Diet for Celiac Disease, Adult The gluten-free diet includes all foods that do not contain gluten. Gluten is a protein that is found in wheat, rye, barley, and some other grains. Following the gluten-free diet is  the only treatment for people with celiac disease. It helps to prevent damage to the intestines and improves or eliminates the symptoms of celiac disease. Following the gluten-free diet requires some planning. It can be challenging at first, but it gets easier with time and practice. There are more gluten-free options available today than ever before. If you need help finding gluten-free foods or if you have questions, talk with your diet and nutrition specialist (registered dietitian) or your health care provider. What do I need to know about a gluten-free diet?  All fruits, vegetables, and meats are safe to eat and do not contain gluten.  When grocery shopping, start by shopping in the produce, meat, and dairy sections. These sections are more likely to contain gluten-free foods. Then move to the aisles that contain packaged foods if you need to.  Read all food labels. Gluten is often added to foods. Always check the ingredient list and look for warnings, such as "may contain gluten."  Talk with your dietitian or health care provider before taking a gluten-free multivitamin or mineral supplement.  Be aware of gluten-free foods having contact with foods that contain gluten (cross-contamination). This can happen at home and with any processed foods. ? Talk with your health care provider or dietitian about how to reduce the risk of cross-contamination in your home. ? If you have questions about how a food is processed, ask the manufacturer. ?  What key words help to identify gluten? Foods that list any of these key words on  the label usually contain gluten:  Wheat, flour, enriched flour, bromated flour, white flour, durum flour, graham flour, phosphated flour, self-rising flour, semolina, farina, barley (malt), rye, and oats.  Starch, dextrin, modified food starch, or cereal.  Thickening, fillers, or emulsifiers.  Malt flavoring, malt extract, or malt syrup.  Hydrolyzed vegetable  protein.  In the [place] packaged foods that are gluten-free are required to be labeled "GF." These foods should be easy to identify and are safe to eat. In the [place] food companies are also required to list common food allergens, including wheat, on their labels. Recommended foods Grains  Amaranth, bean flours, 100% buckwheat flour, corn, millet, nut flours or nut meals, GF oats, quinoa, rice, sorghum, teff, rice wafers, pure cornmeal tortillas, popcorn, and hot cereals made from cornmeal. Hominy, rice, wild rice. Some Asian rice noodles or bean noodles. Arrowroot starch, corn bran, corn flour, corn germ, cornmeal, corn starch, potato flour, potato starch flour, and rice bran. Plain, brown, and sweet rice flours. Rice polish, soy flour, and tapioca starch. Vegetables  All plain fresh, frozen, and canned vegetables. Fruits  All plain fresh, frozen, canned, and dried fruits, and 100% fruit juices. Meats and other protein foods  All fresh beef, pork, poultry, fish, seafood, and eggs. Fish canned in water, oil, brine, or vegetable broth. Plain nuts and seeds, peanut butter. Some lunch meat and some frankfurters. Dried beans, dried peas, and lentils. Dairy  Fresh plain, dry, evaporated, or condensed milk. Cream, butter, sour cream, whipping cream, and most yogurts. Unprocessed cheese, most processed cheeses, some cottage cheese, some cream cheeses. Beverages  Coffee, tea, most herbal teas. Carbonated beverages and some root beers. Wine, sake, and distilled spirits, such as gin, vodka, and whiskey. Most hard ciders. Fats and oils  Butter, margarine, vegetable oil, hydrogenated butter, olive oil, shortening, lard, cream, and some mayonnaise. Some commercial salad dressings. Olives. Sweets and desserts  Sugar, honey, some syrups, molasses, jelly, and jam. Plain hard candy, marshmallows, and gumdrops. Pure cocoa powder. Plain chocolate. Custard and some pudding mixes. Gelatin desserts, sorbets,  frozen ice pops, and sherbet. Cake, cookies, and other desserts prepared with allowed flours. Some commercial ice creams. Cornstarch, tapioca, and rice puddings. Seasoning and other foods  Some canned or frozen soups. Monosodium glutamate (MSG). Cider, rice, and wine vinegar. Baking soda and baking powder. Cream of tartar. Baking and nutritional yeast. Certain soy sauces made without wheat (ask your dietitian about specific brands that are allowed). Nuts, coconut, and chocolate. Salt, pepper, herbs, spices, flavoring extracts, imitation or artificial flavorings, natural flavorings, and food colorings. Some medicines and supplements. Some lip glosses and other cosmetics. Rice syrups. The items listed may not be a complete list. Talk with your dietitian about what dietary choices are best for you. Foods to avoid Grains  Barley, bran, bulgur, couscous, cracked wheat, Spurgeon, farro, graham, malt, matzo, semolina, wheat germ, and all wheat and rye cereals including spelt and kamut. Cereals containing malt as a flavoring, such as rice cereal. Noodles, spaghetti, macaroni, most packaged rice mixes, and all mixes containing wheat, rye, barley, or triticale. Vegetables  Most creamed vegetables and most vegetables canned in sauces. Some commercially prepared vegetables and salads. Fruits  Thickened or prepared fruits and some pie fillings. Some fruit snacks and fruit roll-ups. Meats and other protein foods  Any meat or meat alternative containing wheat, rye, barley, or gluten stabilizers. These are often marinated or packaged meats and lunch meats. Bread-containing products, such as Swiss steak, croquettes, meatballs, and meatloaf.  Most tuna canned in vegetable broth and [place] with hydrolyzed vegetable protein (HVP) injected as part of the basting. Seitan. Imitation fish. Eggs in sauces made from ingredients to avoid. Dairy  Commercial chocolate milk drinks and malted milk. Some non-dairy creamers. Any  cheese product containing ingredients to avoid. Beverages  Certain cereal beverages. Beer, ale, malted milk, and some root beers. Some hard ciders. Some instant flavored coffees. Some herbal teas made with barley or with barley malt added. Fats and oils  Some commercial salad dressings. Sour cream containing modified food starch. Sweets and desserts  Some toffees. Chocolate-coated nuts (may be rolled in wheat flour) and some commercial candies and candy bars. Most cakes, cookies, donuts, pastries, and other baked goods. Some commercial ice cream. Ice cream cones. Commercially prepared mixes for cakes, cookies, and other desserts. Bread pudding and other puddings thickened with flour. Products containing brown rice syrup made with barley malt enzyme. Desserts and sweets made with malt flavoring. Seasoning and other foods  Some curry powders, some dry seasoning mixes, some gravy extracts, some meat sauces, some ketchups, some prepared mustards, and horseradish. Certain soy sauces. Malt vinegar. Bouillon and bouillon cubes that contain HVP. Some chip dips, and some chewing gum. Yeast extract. Brewer's yeast. Caramel color. Some medicines and supplements. Some lip glosses and other cosmetics. The items listed may not be a complete list. Talk with your dietitian about what dietary choices are best for you. Summary  Gluten is a protein that is found in wheat, rye, barley, and some other grains. The gluten-free diet includes all foods that do not contain gluten.  If you need help finding gluten-free foods or if you have questions, talk with your diet and nutrition specialist (registered dietitian) or your health care provider.  Read all food labels. Gluten is often added to foods. Always check the ingredient list and look for warnings, such as "may contain gluten." This information is not intended to replace advice given to you by your health care provider. Make sure you discuss any questions you have  with your health care provider. Document Released: 11/04/2005 Document Revised: 08/19/2016 Document Reviewed: 08/19/2016 Elsevier Interactive Patient Education  2018 [employer].

## 2017-07-09 ENCOUNTER — Telehealth: Payer: Self-pay | Admitting: Physician Assistant

## 2017-07-09 NOTE — Telephone Encounter (Signed)
Received fax for PA on Phentermine sent through cover my meds waiting on authorization. - CF

## 2017-07-10 ENCOUNTER — Encounter: Payer: Self-pay | Admitting: Physician Assistant

## 2017-08-12 ENCOUNTER — Encounter: Payer: Self-pay | Admitting: Physician Assistant

## 2017-08-12 ENCOUNTER — Ambulatory Visit (INDEPENDENT_AMBULATORY_CARE_PROVIDER_SITE_OTHER): Payer: 59 | Admitting: Physician Assistant

## 2017-08-12 VITALS — BP 140/90 | HR 62 | Ht 73.0 in | Wt 246.0 lb

## 2017-08-12 DIAGNOSIS — E669 Obesity, unspecified: Secondary | ICD-10-CM

## 2017-08-12 DIAGNOSIS — R635 Abnormal weight gain: Secondary | ICD-10-CM | POA: Diagnosis not present

## 2017-08-12 MED ORDER — PHENTERMINE HCL 37.5 MG PO TABS: 37.5000 mg | ORAL_TABLET | Freq: Every day | ORAL | 0 refills | Status: AC

## 2017-08-12 NOTE — Progress Notes (Addendum)
   Subjective:    Patient ID: Omar Keller, male    DOB: December 24, 1980, 36 y.o.   MRN: [id number]  HPI The patient is here today for a refill of his Phentermine. He has had a 9 lb weight loss in the last month. He reports mild dry mouth, but denies palpitations,chest pain, or insomnia. He reported that he took a pre-workout with caffeine this morning, so he believes that may be the cause of his elevated BP. He is currently running/walking 3-4 times per week for an hour. He says that he is grocery shopping and cooking more often.   .. Active Ambulatory Problems    Diagnosis Date Noted  . Routine general medical examination at a health care facility 11/02/2015  . Essential hypertension, benign 11/02/2015  . Erectile dysfunction 02/06/2016   Resolved Ambulatory Problems    Diagnosis Date Noted  . No Resolved Ambulatory Problems   Past Medical History:  Diagnosis Date  . Hypertension      Review of Systems  All other systems reviewed and are negative.      Objective:   Physical Exam  Constitutional: He is oriented to person, place, and time. He appears well-developed and well-nourished.  HENT:  Head: Normocephalic and atraumatic.  Cardiovascular: Normal rate, regular rhythm and normal heart sounds.   Pulmonary/Chest: Effort normal and breath sounds normal.  Neurological: He is alert and oriented to person, place, and time.  Psychiatric: He has a normal mood and affect. His behavior is normal.           Assessment & Plan:  Marland KitchenMarland KitchenShad was seen today for obesity.  Diagnoses and all orders for this visit:  Obesity (BMI 30.0-34.9) -     phentermine (ADIPEX-P) 37.5 MG tablet; Take 1 tablet (37.5 mg total) by mouth daily before breakfast.  Abnormal weight gain -     phentermine (ADIPEX-P) 37.5 MG tablet; Take 1 tablet (37.5 mg total) by mouth daily before breakfast.    Refill of Phentermine was given for the remaining 2 months.  The patient was re-educated about the importance  of diet and exercise for continued weight loss. He is tolerating the medication with little to no side effects.  We will finish out the phentermine for the remaining 12 weeks.

## 2017-08-14 ENCOUNTER — Other Ambulatory Visit: Payer: Self-pay | Admitting: Internal Medicine

## 2017-08-14 DIAGNOSIS — N521 Erectile dysfunction due to diseases classified elsewhere: Secondary | ICD-10-CM

## 2017-09-15 ENCOUNTER — Other Ambulatory Visit: Payer: Self-pay | Admitting: Internal Medicine

## 2017-09-15 DIAGNOSIS — N521 Erectile dysfunction due to diseases classified elsewhere: Secondary | ICD-10-CM

## 2017-09-18 ENCOUNTER — Telehealth: Payer: Self-pay | Admitting: Physician Assistant

## 2017-09-18 NOTE — Telephone Encounter (Signed)
Pt called adv that he forgot to mention that he needs a refill for Viagra called into his pharmacy. He forgot to mention it when he seen you in his prevs appt. Please adv

## 2017-09-18 NOTE — Telephone Encounter (Signed)
Routing to PCP for review.

## 2017-09-19 ENCOUNTER — Other Ambulatory Visit: Payer: Self-pay | Admitting: Physician Assistant

## 2017-09-19 DIAGNOSIS — N521 Erectile dysfunction due to diseases classified elsewhere: Secondary | ICD-10-CM

## 2017-09-19 MED ORDER — SILDENAFIL CITRATE 50 MG PO TABS: ORAL_TABLET | ORAL | 5 refills | Status: AC

## 2017-09-19 NOTE — Telephone Encounter (Signed)
Sent refills to [facility]

## 2017-09-19 NOTE — Telephone Encounter (Signed)
Left message advising of refill.
